# Patient Record
Sex: Female | Born: 1965 | Race: Black or African American | Hispanic: No | Marital: Single | Smoking: Never smoker
Health system: Southern US, Community
[De-identification: ages and names within clinical notes are randomized; demographics above are authoritative.]

## PROBLEM LIST (undated history)

## (undated) HISTORY — PX: TUBAL LIGATION: SHX77

---

## 1999-05-11 ENCOUNTER — Other Ambulatory Visit: Admission: RE | Admit: 1999-05-11 | Discharge: 1999-05-11 | Payer: Self-pay | Admitting: Obstetrics and Gynecology

## 2005-10-08 ENCOUNTER — Emergency Department (HOSPITAL_COMMUNITY): Admission: EM | Admit: 2005-10-08 | Discharge: 2005-10-08 | Payer: Self-pay | Admitting: Emergency Medicine

## 2006-05-18 ENCOUNTER — Ambulatory Visit: Payer: Self-pay | Admitting: Family Medicine

## 2006-06-18 ENCOUNTER — Ambulatory Visit: Payer: Self-pay | Admitting: Family Medicine

## 2006-06-26 ENCOUNTER — Ambulatory Visit: Payer: Self-pay | Admitting: *Deleted

## 2006-06-28 ENCOUNTER — Ambulatory Visit: Payer: Self-pay | Admitting: Family Medicine

## 2006-06-28 ENCOUNTER — Encounter (INDEPENDENT_AMBULATORY_CARE_PROVIDER_SITE_OTHER): Payer: Self-pay | Admitting: Specialist

## 2006-07-24 ENCOUNTER — Ambulatory Visit: Payer: Self-pay | Admitting: Internal Medicine

## 2006-07-26 ENCOUNTER — Ambulatory Visit: Payer: Self-pay | Admitting: Internal Medicine

## 2006-09-17 ENCOUNTER — Ambulatory Visit: Payer: Self-pay | Admitting: Family Medicine

## 2007-05-02 DIAGNOSIS — D649 Anemia, unspecified: Secondary | ICD-10-CM | POA: Insufficient documentation

## 2007-05-06 DIAGNOSIS — E7800 Pure hypercholesterolemia, unspecified: Secondary | ICD-10-CM | POA: Insufficient documentation

## 2007-05-06 DIAGNOSIS — E663 Overweight: Secondary | ICD-10-CM | POA: Insufficient documentation

## 2007-05-06 DIAGNOSIS — I10 Essential (primary) hypertension: Secondary | ICD-10-CM | POA: Insufficient documentation

## 2007-05-15 ENCOUNTER — Encounter (INDEPENDENT_AMBULATORY_CARE_PROVIDER_SITE_OTHER): Payer: Self-pay | Admitting: *Deleted

## 2008-09-09 ENCOUNTER — Emergency Department (HOSPITAL_COMMUNITY): Admission: EM | Admit: 2008-09-09 | Discharge: 2008-09-09 | Payer: Self-pay | Admitting: Family Medicine

## 2009-04-22 ENCOUNTER — Emergency Department (HOSPITAL_COMMUNITY): Admission: EM | Admit: 2009-04-22 | Discharge: 2009-04-22 | Payer: Self-pay | Admitting: Emergency Medicine

## 2010-06-09 ENCOUNTER — Emergency Department (HOSPITAL_COMMUNITY): Admission: EM | Admit: 2010-06-09 | Discharge: 2010-06-10 | Payer: Self-pay | Admitting: Emergency Medicine

## 2010-06-09 ENCOUNTER — Emergency Department (HOSPITAL_COMMUNITY): Admission: EM | Admit: 2010-06-09 | Discharge: 2010-06-09 | Payer: Self-pay | Admitting: Emergency Medicine

## 2010-11-10 LAB — CBC
MCH: 22.1 pg — ABNORMAL LOW (ref 26.0–34.0)
MCH: 22.4 pg — ABNORMAL LOW (ref 26.0–34.0)
MCHC: 30.9 g/dL (ref 30.0–36.0)
MCHC: 31 g/dL (ref 30.0–36.0)
MCV: 71.4 fL — ABNORMAL LOW (ref 78.0–100.0)
MCV: 72.2 fL — ABNORMAL LOW (ref 78.0–100.0)
Platelets: 434 10*3/uL — ABNORMAL HIGH (ref 150–400)
Platelets: 458 10*3/uL — ABNORMAL HIGH (ref 150–400)
RDW: 17.6 % — ABNORMAL HIGH (ref 11.5–15.5)

## 2010-11-10 LAB — COMPREHENSIVE METABOLIC PANEL
AST: 37 U/L (ref 0–37)
Albumin: 3.7 g/dL (ref 3.5–5.2)
BUN: 6 mg/dL (ref 6–23)
Calcium: 9.2 mg/dL (ref 8.4–10.5)
Chloride: 106 mEq/L (ref 96–112)
Creatinine, Ser: 0.66 mg/dL (ref 0.4–1.2)
GFR calc Af Amer: >60 mL/min (ref >60)
Total Bilirubin: 0.6 mg/dL (ref 0.3–1.2)
Total Protein: 7.8 g/dL (ref 6.0–8.3)

## 2010-11-10 LAB — DIFFERENTIAL
Basophils Relative: 0 % (ref 0–1)
Basophils Relative: 0 % (ref 0–1)
Eosinophils Absolute: 0 10*3/uL (ref 0.0–0.7)
Eosinophils Absolute: 0 10*3/uL (ref 0.0–0.7)
Eosinophils Relative: 0 % (ref 0–5)
Lymphocytes Relative: 15 % (ref 12–46)
Monocytes Absolute: 0.5 10*3/uL (ref 0.1–1.0)
Neutrophils Relative %: 84 % — ABNORMAL HIGH (ref 43–77)

## 2010-11-10 LAB — URINALYSIS, ROUTINE W REFLEX MICROSCOPIC
Bilirubin Urine: NEGATIVE
Nitrite: NEGATIVE
Protein, ur: 30 mg/dL — AB
Specific Gravity, Urine: 1.026 (ref 1.005–1.030)
Urobilinogen, UA: 1 mg/dL (ref 0.0–1.0)

## 2010-11-10 LAB — POCT URINALYSIS DIPSTICK
Glucose, UA: 100 mg/dL — AB
Nitrite: NEGATIVE
Urobilinogen, UA: 1 mg/dL (ref 0.0–1.0)
pH: 7 (ref 5.0–8.0)

## 2010-11-10 LAB — URINE CULTURE
Colony Count: NO GROWTH
Culture  Setup Time: 201110140046
Culture: NO GROWTH

## 2010-11-10 LAB — URINE MICROSCOPIC-ADD ON

## 2010-11-10 LAB — WET PREP, GENITAL
Trich, Wet Prep: NONE SEEN
WBC, Wet Prep HPF POC: NONE SEEN

## 2010-11-10 LAB — GC/CHLAMYDIA PROBE AMP, GENITAL
Chlamydia, DNA Probe: NEGATIVE
GC Probe Amp, Genital: NEGATIVE

## 2010-12-03 LAB — DIFFERENTIAL
Basophils Relative: 1 % (ref 0–1)
Eosinophils Absolute: 0.1 10*3/uL (ref 0.0–0.7)
Eosinophils Relative: 1 % (ref 0–5)
Lymphs Abs: 2.1 10*3/uL (ref 0.7–4.0)
Monocytes Absolute: 0.6 10*3/uL (ref 0.1–1.0)
Neutro Abs: 12 10*3/uL — ABNORMAL HIGH (ref 1.7–7.7)

## 2010-12-03 LAB — POCT I-STAT, CHEM 8
BUN: <3 mg/dL — ABNORMAL LOW (ref 6–23)
Calcium, Ion: 1.12 mmol/L (ref 1.12–1.32)
Chloride: 104 mEq/L (ref 96–112)
Creatinine, Ser: 0.7 mg/dL (ref 0.4–1.2)
TCO2: 21 mmol/L (ref 0–100)

## 2010-12-03 LAB — URINALYSIS, ROUTINE W REFLEX MICROSCOPIC
Bilirubin Urine: NEGATIVE
Nitrite: NEGATIVE
Protein, ur: NEGATIVE mg/dL
Specific Gravity, Urine: 1 — ABNORMAL LOW (ref 1.005–1.030)
Urobilinogen, UA: 0.2 mg/dL (ref 0.0–1.0)

## 2010-12-03 LAB — CBC
HCT: 31.3 % — ABNORMAL LOW (ref 36.0–46.0)
Hemoglobin: 10.1 g/dL — ABNORMAL LOW (ref 12.0–15.0)
MCHC: 32.3 g/dL (ref 30.0–36.0)
MCV: 75.5 fL — ABNORMAL LOW (ref 78.0–100.0)
RBC: 4.14 MIL/uL (ref 3.87–5.11)

## 2010-12-03 LAB — URINE MICROSCOPIC-ADD ON

## 2011-10-26 ENCOUNTER — Emergency Department (INDEPENDENT_AMBULATORY_CARE_PROVIDER_SITE_OTHER)
Admission: EM | Admit: 2011-10-26 | Discharge: 2011-10-26 | Disposition: A | Discharge status: Home / Self Care | Payer: Self-pay | Source: Home / Self Care | Attending: Family Medicine | Admitting: Family Medicine

## 2011-10-26 ENCOUNTER — Encounter (HOSPITAL_COMMUNITY): Payer: Self-pay | Admitting: *Deleted

## 2011-10-26 DIAGNOSIS — J069 Acute upper respiratory infection, unspecified: Secondary | ICD-10-CM

## 2011-10-26 LAB — POCT RAPID STREP A: Streptococcus, Group A Screen (Direct): NEGATIVE

## 2011-10-26 MED ORDER — DEXTROMETHORPHAN POLISTIREX 30 MG/5ML PO LQCR: 60.0000 mg | Freq: Two times a day (BID) | ORAL | Status: AC

## 2011-10-26 MED ORDER — ACETAMINOPHEN 325 MG PO TABS
975.0000 mg | ORAL_TABLET | Freq: Once | ORAL | Status: AC
  Administered 2011-10-26: 975 mg via ORAL

## 2011-10-26 MED ORDER — IPRATROPIUM BROMIDE 0.06 % NA SOLN: 2.0000 | Freq: Four times a day (QID) | NASAL | Status: DC

## 2011-10-26 MED ORDER — ACETAMINOPHEN 325 MG PO TABS
ORAL_TABLET | ORAL | Status: AC
  Filled 2011-10-26: qty 3

## 2011-10-26 NOTE — ED Provider Notes (Signed)
History     CSN: [id number]  Arrival date & time 10/26/11  4098   First MD Initiated Contact with Patient 10/26/11 1849      Chief Complaint  Patient presents with  . Sore Throat    (Consider location/radiation/quality/duration/timing/severity/associated sxs/prior treatment) Patient is a 46 y.o. female presenting with pharyngitis. The history is provided by the patient.  Sore Throat This is a new problem. The current episode started more than 2 days ago. The problem occurs constantly. The problem has not changed since onset.Pertinent negatives include no chest pain and no abdominal pain. Associated symptoms comments: Also with head cong and nonprod cough. The symptoms are aggravated by swallowing.    History reviewed. No pertinent past medical history.  Past Surgical History  Procedure Date  . Tubal ligation     Family History  Problem Relation Age of Onset  . Diabetes Mother     History  Substance Use Topics  . Smoking status: Never Smoker   . Smokeless tobacco: Not on file  . Alcohol Use: No    OB History    Grav Para Term Preterm Abortions TAB SAB Ect Mult Living   2 2              Review of Systems  Constitutional: Negative.   HENT: Positive for congestion, sore throat, rhinorrhea and postnasal drip.   Respiratory: Positive for cough.   Cardiovascular: Negative for chest pain.  Gastrointestinal: Negative.  Negative for abdominal pain.    Allergies  Review of patient's allergies indicates no known allergies.  Home Medications   Current Outpatient Rx  Name Route Sig Dispense Refill  . DEXTROMETHORPHAN POLISTIREX ER 30 MG/5ML PO LQCR Oral Take 10 mLs (60 mg total) by mouth 2 (two) times daily. 89 mL 0  . IPRATROPIUM BROMIDE 0.06 % NA SOLN Nasal Place 2 sprays into the nose 4 (four) times daily. 15 mL 1    BP 176/89  Pulse 124  Temp(Src) 102 F (38.9 C) (Oral)  Resp 20  SpO2 100%  LMP 10/13/2011  Physical Exam  Nursing note and vitals  reviewed. Constitutional: She appears well-developed and well-nourished.  HENT:  Head: Normocephalic.  Right Ear: External ear normal.  Left Ear: External ear normal.  Mouth/Throat: Mucous membranes are normal. Posterior oropharyngeal erythema present.  Eyes: Pupils are equal, round, and reactive to light.  Neck: Normal range of motion. Neck supple.  Pulmonary/Chest: Effort normal and breath sounds normal.  Lymphadenopathy:    She has no cervical adenopathy.  Skin: Skin is warm and dry.    ED Course  Procedures (including critical care time)   Labs Reviewed  POCT RAPID STREP A ([facility] ONLY)   No results found.   1. URI (upper respiratory infection)       MDM  Strep neg.        Barkley Bruns, MD 10/26/11 [phone]

## 2011-10-26 NOTE — ED Notes (Signed)
C/o severe sore throat and cough causing her chest to hurt

## 2011-10-26 NOTE — ED Notes (Signed)
State sore throat and cough causing throat to hurt since Saturday 2/23

## 2011-10-26 NOTE — Discharge Instructions (Signed)
Drink plenty of fluids as discussed, use medicine as prescribed, and mucinex or delsym for cough.lozenges or salt water gargle for sore throat. Return or see your doctor if further problems

## 2014-06-29 ENCOUNTER — Encounter (HOSPITAL_COMMUNITY): Payer: Self-pay | Admitting: *Deleted

## 2015-04-20 ENCOUNTER — Emergency Department (HOSPITAL_COMMUNITY)
Admission: EM | Admit: 2015-04-20 | Discharge: 2015-04-20 | Disposition: A | Discharge status: Home / Self Care | Payer: Self-pay | Source: Home / Self Care | Attending: Emergency Medicine | Admitting: Emergency Medicine

## 2015-04-20 ENCOUNTER — Encounter (HOSPITAL_COMMUNITY): Payer: Self-pay | Admitting: Emergency Medicine

## 2015-04-20 DIAGNOSIS — IMO0001 Reserved for inherently not codable concepts without codable children: Secondary | ICD-10-CM

## 2015-04-20 DIAGNOSIS — H109 Unspecified conjunctivitis: Secondary | ICD-10-CM

## 2015-04-20 DIAGNOSIS — R030 Elevated blood-pressure reading, without diagnosis of hypertension: Secondary | ICD-10-CM

## 2015-04-20 MED ORDER — POLYMYXIN B-TRIMETHOPRIM 10000-0.1 UNIT/ML-% OP SOLN: 1.0000 [drp] | OPHTHALMIC | Status: AC

## 2015-04-20 NOTE — ED Provider Notes (Signed)
CSN: [id number]     Arrival date & time 04/20/15  1814 History   First MD Initiated Contact with Patient 04/20/15 1930     Chief Complaint  Patient presents with  . Eye Problem   (Consider location/radiation/quality/duration/timing/severity/associated sxs/prior Treatment) HPI  She is a 49 year old woman here for evaluation of left eye redness. She states when she woke up this morning her eye was a little red. It has gotten progressively more red over the course of the day. She denies any pain, itching, change in vision, sensitivity to light, or drainage.    She also states that her blood pressure tends to be elevated when she sees the doctor.  No chest pain, shortness of breath, headache, dizziness.  History reviewed. No pertinent past medical history. Past Surgical History  Procedure Laterality Date  . Tubal ligation     Family History  Problem Relation Age of Onset  . Diabetes Mother    Social History  Substance Use Topics  . Smoking status: Never Smoker   . Smokeless tobacco: None  . Alcohol Use: No   OB History    Gravida Para Term Preterm AB TAB SAB Ectopic Multiple Living   2 2             Review of Systems As in history of present illness Allergies  Review of patient's allergies indicates no known allergies.  Home Medications   Prior to Admission medications   Medication Sig Start Date End Date Taking? Authorizing Provider  ipratropium (ATROVENT) 0.06 % nasal spray Place 2 sprays into the nose 4 (four) times daily. 10/26/11 10/25/12  Linna Hoff, MD  trimethoprim-polymyxin b (POLYTRIM) ophthalmic solution Place 1 drop into the left eye every 4 (four) hours. For 5 days 04/20/15   Charm Rings, MD   BP 158/112 mmHg  Pulse 100  Temp(Src) 98.6 F (37 C) (Oral)  Resp 14  SpO2 99%  LMP 04/17/2015 Physical Exam  Constitutional: She is oriented to person, place, and time. She appears well-developed and well-nourished. No distress.  Eyes: EOM are normal. Pupils are  equal, round, and reactive to light.  Left conjunctiva is injected.  Cardiovascular: Normal rate.   Pulmonary/Chest: Effort normal.  Neurological: She is alert and oriented to person, place, and time.    ED Course  Procedures (including critical care time) Labs Review Labs Reviewed - No data to display  Imaging Review No results found.   MDM   1. Conjunctivitis of left eye   2. Elevated BP    Treat with Polytrim eyedrops. Blood pressure is elevated. Recommended checking at the drug store several times over the next few weeks. Recommended establishing with a primary care provider.    Charm Rings, MD 04/20/15 [phone]

## 2015-04-20 NOTE — Discharge Instructions (Signed)
You have conjunctivitis. Use eyedrops for the next 5 days. If things are not improving in the next 2-3 days, you start having pain in that eye, or your vision changes, please go to the eye doctor right away.  Please check your blood pressure a couple of times over the next few weeks at the drug store or [facility]. If it is consistently elevated you'll need to find a doctor to start blood pressure medicine. If it is less than 140/90 you are okay.

## 2015-04-20 NOTE — ED Notes (Signed)
Left eye red, noticed today.  No known injury, no drainage, no pain, no vision changes

## 2016-08-14 ENCOUNTER — Ambulatory Visit (HOSPITAL_COMMUNITY)
Admission: EM | Admit: 2016-08-14 | Discharge: 2016-08-14 | Disposition: A | Discharge status: Home / Self Care | Payer: Self-pay | Attending: Family Medicine | Admitting: Family Medicine

## 2016-08-14 ENCOUNTER — Encounter (HOSPITAL_COMMUNITY): Payer: Self-pay | Admitting: *Deleted

## 2016-08-14 DIAGNOSIS — J069 Acute upper respiratory infection, unspecified: Secondary | ICD-10-CM

## 2016-08-14 DIAGNOSIS — B9789 Other viral agents as the cause of diseases classified elsewhere: Secondary | ICD-10-CM

## 2016-08-14 MED ORDER — IPRATROPIUM BROMIDE 0.06 % NA SOLN: 2.0000 | Freq: Four times a day (QID) | NASAL | 1 refills | Status: AC

## 2016-08-14 MED ORDER — GUAIFENESIN-CODEINE 100-10 MG/5ML PO SYRP: 10.0000 mL | ORAL_SOLUTION | Freq: Four times a day (QID) | ORAL | 0 refills | Status: AC | PRN

## 2016-08-14 NOTE — Discharge Instructions (Signed)
Drink plenty of fluids as discussed, use medicine as prescribed, and mucinex or delsym for cough. Return or see your doctor if further problems °

## 2016-08-14 NOTE — ED Triage Notes (Signed)
Pt  Reports  Symptoms    Of   Cough  /  Congestion  X  2-3  Days       she     Has   Sinus  Drainage        And   The  Symptoms  Have  Not  Been releived  By otc  meds    She  Is  Sitting  Upright on the  Exam table  In  No  Severe  Distress

## 2016-08-14 NOTE — ED Provider Notes (Signed)
[facility]    CSN: [id number] Arrival date & time: 08/14/16  1155     History   Chief Complaint Chief Complaint  Patient presents with  . URI    HPI Dione Housekeeperda Grima is a 50 y.o. female.   The history is provided by the patient.  URI  Presenting symptoms: congestion, cough, rhinorrhea and sore throat   Presenting symptoms: no fever   Severity:  Mild Onset quality:  Gradual Duration:  3 days Progression:  Unchanged Chronicity:  New Relieved by:  None tried Worsened by:  Nothing Ineffective treatments:  None tried Risk factors: sick contacts     History reviewed. No pertinent past medical history.  Patient Active Problem List   Diagnosis Date Noted  . HYPERCHOLESTEROLEMIA 05/06/2007  . OBESITY, MILD 05/06/2007  . HYPERTENSION, BORDERLINE 05/06/2007  . ANEMIA 05/02/2007    Past Surgical History:  Procedure Laterality Date  . TUBAL LIGATION      OB History    Gravida Para Term Preterm AB Living   2 2           SAB TAB Ectopic Multiple Live Births                   Home Medications    Prior to Admission medications   Medication Sig Start Date End Date Taking? Authorizing Provider  guaiFENesin-codeine (ROBITUSSIN AC) 100-10 MG/5ML syrup Take 10 mLs by mouth 4 (four) times daily as needed for cough. 08/14/16   Linna HoffJames D Chontel Warning, MD  ipratropium (ATROVENT) 0.06 % nasal spray Place 2 sprays into both nostrils 4 (four) times daily. 08/14/16   Linna HoffJames D Taryll Reichenberger, MD  trimethoprim-polymyxin b (POLYTRIM) ophthalmic solution Place 1 drop into the left eye every 4 (four) hours. For 5 days 04/20/15   Charm RingsErin J Honig, MD    Family History Family History  Problem Relation Age of Onset  . Diabetes Mother     Social History Social History  Substance Use Topics  . Smoking status: Never Smoker  . Smokeless tobacco: Not on file  . Alcohol use No     Allergies   Patient has no known allergies.   Review of Systems Review of Systems  Constitutional: Negative.   Negative for fever.  HENT: Positive for congestion, postnasal drip, rhinorrhea and sore throat.   Respiratory: Positive for cough.   Cardiovascular: Negative.   Gastrointestinal: Negative.   All other systems reviewed and are negative.    Physical Exam Triage Vital Signs ED Triage Vitals  Enc Vitals Group     BP      Pulse      Resp      Temp      Temp src      SpO2      Weight      Height      Head Circumference      Peak Flow      Pain Score      Pain Loc      Pain Edu?      Excl. in GC?    No data found.   Updated Vital Signs There were no vitals taken for this visit.  Visual Acuity Right Eye Distance:   Left Eye Distance:   Bilateral Distance:    Right Eye Near:   Left Eye Near:    Bilateral Near:     Physical Exam  Constitutional: She is oriented to person, place, and time. She appears well-developed and well-nourished. No distress.  HENT:  Right Ear: External ear normal.  Left Ear: External ear normal.  Nose: Nose normal.  Mouth/Throat: Oropharynx is clear and moist.  Eyes: Pupils are equal, round, and reactive to light.  Neck: Normal range of motion. Neck supple.  Cardiovascular: Normal rate, regular rhythm, normal heart sounds and intact distal pulses.   Pulmonary/Chest: Effort normal and breath sounds normal.  Lymphadenopathy:    She has no cervical adenopathy.  Neurological: She is alert and oriented to person, place, and time.  Skin: Skin is warm and dry.  Nursing note and vitals reviewed.    UC Treatments / Results  Labs (all labs ordered are listed, but only abnormal results are displayed) Labs Reviewed - No data to display  EKG  EKG Interpretation None       Radiology No results found.  Procedures Procedures (including critical care time)  Medications Ordered in UC Medications - No data to display   Initial Impression / Assessment and Plan / UC Course  I have reviewed the triage vital signs and the nursing  notes.  Pertinent labs & imaging results that were available during my care of the patient were reviewed by me and considered in my medical decision making (see chart for details).  Clinical Course      Final Clinical Impressions(s) / UC Diagnoses   Final diagnoses:  Viral URI with cough    New Prescriptions Discharge Medication List as of 08/14/2016  1:47 PM    START taking these medications   Details  guaiFENesin-codeine (ROBITUSSIN AC) 100-10 MG/5ML syrup Take 10 mLs by mouth 4 (four) times daily as needed for cough., Starting Mon 08/14/2016, Print         Linna HoffJames D Waldon Sheerin, MD 08/20/16 [phone]

## 2018-11-05 ENCOUNTER — Emergency Department (HOSPITAL_COMMUNITY)
Admission: EM | Admit: 2018-11-05 | Discharge: 2018-11-05 | Disposition: A | Discharge status: Home / Self Care | Payer: No Typology Code available for payment source | Attending: Emergency Medicine | Admitting: Emergency Medicine

## 2018-11-05 ENCOUNTER — Other Ambulatory Visit: Payer: Self-pay

## 2018-11-05 ENCOUNTER — Encounter (HOSPITAL_COMMUNITY): Payer: Self-pay

## 2018-11-05 ENCOUNTER — Emergency Department (HOSPITAL_COMMUNITY): Payer: No Typology Code available for payment source

## 2018-11-05 DIAGNOSIS — Y999 Unspecified external cause status: Secondary | ICD-10-CM | POA: Diagnosis not present

## 2018-11-05 DIAGNOSIS — R109 Unspecified abdominal pain: Secondary | ICD-10-CM | POA: Insufficient documentation

## 2018-11-05 DIAGNOSIS — R52 Pain, unspecified: Secondary | ICD-10-CM

## 2018-11-05 DIAGNOSIS — Z7982 Long term (current) use of aspirin: Secondary | ICD-10-CM | POA: Insufficient documentation

## 2018-11-05 DIAGNOSIS — Y92410 Unspecified street and highway as the place of occurrence of the external cause: Secondary | ICD-10-CM | POA: Insufficient documentation

## 2018-11-05 DIAGNOSIS — I10 Essential (primary) hypertension: Secondary | ICD-10-CM | POA: Diagnosis not present

## 2018-11-05 DIAGNOSIS — Y9389 Activity, other specified: Secondary | ICD-10-CM | POA: Diagnosis not present

## 2018-11-05 LAB — COMPREHENSIVE METABOLIC PANEL
ALBUMIN: 4.4 g/dL (ref 3.5–5.0)
ALK PHOS: 103 U/L (ref 38–126)
ALT: 23 U/L (ref 0–44)
AST: 28 U/L (ref 15–41)
Anion gap: 9 (ref 5–15)
BILIRUBIN TOTAL: 0.8 mg/dL (ref 0.3–1.2)
BUN: 6 mg/dL (ref 6–20)
CALCIUM: 9.7 mg/dL (ref 8.9–10.3)
CO2: 24 mmol/L (ref 22–32)
CREATININE: 0.86 mg/dL (ref 0.44–1.00)
Chloride: 104 mmol/L (ref 98–111)
GFR calc non Af Amer: >60 mL/min (ref >60)
GLUCOSE: 101 mg/dL — AB (ref 70–99)
Potassium: 3.8 mmol/L (ref 3.5–5.1)
SODIUM: 137 mmol/L (ref 135–145)
Total Protein: 8.4 g/dL — ABNORMAL HIGH (ref 6.5–8.1)

## 2018-11-05 LAB — CBC WITH DIFFERENTIAL/PLATELET
ABS IMMATURE GRANULOCYTES: 0.02 10*3/uL (ref 0.00–0.07)
Basophils Absolute: 0 10*3/uL (ref 0.0–0.1)
Basophils Relative: 1 %
EOS PCT: 1 %
Eosinophils Absolute: 0.1 10*3/uL (ref 0.0–0.5)
HEMATOCRIT: 41.6 % (ref 36.0–46.0)
HEMOGLOBIN: 13.2 g/dL (ref 12.0–15.0)
Immature Granulocytes: 0 %
LYMPHS ABS: 2.5 10*3/uL (ref 0.7–4.0)
Lymphocytes Relative: 30 %
MCH: 26.6 pg (ref 26.0–34.0)
MCHC: 31.7 g/dL (ref 30.0–36.0)
MCV: 83.7 fL (ref 80.0–100.0)
MONOS PCT: 8 %
Monocytes Absolute: 0.7 10*3/uL (ref 0.1–1.0)
NRBC: 0 % (ref 0.0–0.2)
Neutro Abs: 5.1 10*3/uL (ref 1.7–7.7)
Neutrophils Relative %: 60 %
Platelets: 363 10*3/uL (ref 150–400)
RBC: 4.97 MIL/uL (ref 3.87–5.11)
RDW: 14.2 % (ref 11.5–15.5)
WBC: 8.3 10*3/uL (ref 4.0–10.5)

## 2018-11-05 LAB — I-STAT CREATININE, ED: Creatinine, Ser: 0.8 mg/dL (ref 0.44–1.00)

## 2018-11-05 LAB — LIPASE, BLOOD: Lipase: 27 U/L (ref 11–51)

## 2018-11-05 MED ORDER — SODIUM CHLORIDE 0.9 % IV BOLUS
1000.0000 mL | Freq: Once | INTRAVENOUS | Status: AC
  Administered 2018-11-05: 1000 mL via INTRAVENOUS

## 2018-11-05 MED ORDER — METHOCARBAMOL 500 MG PO TABS: 500.0000 mg | ORAL_TABLET | Freq: Three times a day (TID) | ORAL | 0 refills | Status: AC

## 2018-11-05 MED ORDER — ONDANSETRON HCL 4 MG/2ML IJ SOLN
4.0000 mg | Freq: Once | INTRAMUSCULAR | Status: DC
  Filled 2018-11-05: qty 2

## 2018-11-05 MED ORDER — IOHEXOL 300 MG/ML  SOLN
100.0000 mL | Freq: Once | INTRAMUSCULAR | Status: AC | PRN
  Administered 2018-11-05: 100 mL via INTRAVENOUS

## 2018-11-05 MED ORDER — MORPHINE SULFATE (PF) 4 MG/ML IV SOLN
4.0000 mg | Freq: Once | INTRAVENOUS | Status: DC
  Filled 2018-11-05: qty 1
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
